# Patient Record
Sex: Female | Born: 1970 | Race: White | Hispanic: No | Marital: Single | Smoking: Never smoker
Health system: Southern US, Community
[De-identification: ages and names within clinical notes are randomized; demographics above are authoritative.]

## PROBLEM LIST (undated history)

## (undated) DIAGNOSIS — J309 Allergic rhinitis, unspecified: Secondary | ICD-10-CM

## (undated) DIAGNOSIS — I10 Essential (primary) hypertension: Secondary | ICD-10-CM

## (undated) DIAGNOSIS — F988 Other specified behavioral and emotional disorders with onset usually occurring in childhood and adolescence: Secondary | ICD-10-CM

## (undated) DIAGNOSIS — B001 Herpesviral vesicular dermatitis: Secondary | ICD-10-CM

## (undated) DIAGNOSIS — K589 Irritable bowel syndrome without diarrhea: Secondary | ICD-10-CM

## (undated) HISTORY — DX: Essential (primary) hypertension: I10

## (undated) HISTORY — DX: Allergic rhinitis, unspecified: J30.9

## (undated) HISTORY — DX: Other specified behavioral and emotional disorders with onset usually occurring in childhood and adolescence: F98.8

## (undated) HISTORY — DX: Herpesviral vesicular dermatitis: B00.1

## (undated) HISTORY — PX: CHOLECYSTECTOMY: SHX55

## (undated) HISTORY — DX: Irritable bowel syndrome without diarrhea: K58.9

---

## 2007-05-11 ENCOUNTER — Encounter: Payer: Self-pay | Admitting: Cardiovascular Disease

## 2007-05-11 ENCOUNTER — Ambulatory Visit: Payer: Self-pay | Admitting: Cardiology

## 2007-05-12 ENCOUNTER — Encounter: Payer: Self-pay | Admitting: Cardiovascular Disease

## 2010-12-09 ENCOUNTER — Ambulatory Visit (INDEPENDENT_AMBULATORY_CARE_PROVIDER_SITE_OTHER): Payer: Medicaid Other | Admitting: Internal Medicine

## 2010-12-09 DIAGNOSIS — K589 Irritable bowel syndrome without diarrhea: Secondary | ICD-10-CM

## 2010-12-29 ENCOUNTER — Ambulatory Visit (INDEPENDENT_AMBULATORY_CARE_PROVIDER_SITE_OTHER): Payer: Self-pay | Admitting: Internal Medicine

## 2011-02-23 ENCOUNTER — Ambulatory Visit (INDEPENDENT_AMBULATORY_CARE_PROVIDER_SITE_OTHER): Payer: Self-pay | Admitting: Internal Medicine

## 2013-08-31 ENCOUNTER — Encounter: Payer: Self-pay | Admitting: Cardiovascular Disease

## 2013-09-20 ENCOUNTER — Encounter: Payer: Self-pay | Admitting: Cardiovascular Disease

## 2013-09-21 ENCOUNTER — Encounter: Payer: Self-pay | Admitting: *Deleted

## 2013-09-25 ENCOUNTER — Ambulatory Visit (INDEPENDENT_AMBULATORY_CARE_PROVIDER_SITE_OTHER): Payer: Medicaid Other | Admitting: Cardiovascular Disease

## 2013-09-25 ENCOUNTER — Encounter: Payer: Self-pay | Admitting: Cardiovascular Disease

## 2013-09-25 ENCOUNTER — Telehealth: Payer: Self-pay | Admitting: Cardiovascular Disease

## 2013-09-25 VITALS — BP 168/108 | HR 68 | Ht 63.0 in | Wt 253.0 lb

## 2013-09-25 DIAGNOSIS — R079 Chest pain, unspecified: Secondary | ICD-10-CM

## 2013-09-25 DIAGNOSIS — I10 Essential (primary) hypertension: Secondary | ICD-10-CM

## 2013-09-25 MED ORDER — LOSARTAN POTASSIUM 25 MG PO TABS: 25.0000 mg | ORAL_TABLET | Freq: Every day | ORAL | Status: DC

## 2013-09-25 NOTE — Progress Notes (Signed)
Patient ID: Cynthia Meyers, female   DOB: 1970-11-23, 43 y.o.   MRN: [id number]       CARDIOLOGY CONSULT NOTE  Patient ID: Cynthia Meyers MRN: [id number] DOB/AGE: 21-Jun-1971 43 y.o.  Admit date: (Not on file) Primary Physician Ernestine Conrad, MD  Reason for Consultation: chest pain, HTN  HPI: The patient is a 43 year old female who I am evaluating for the first time. She has a history of hypertension and obesity. She was evaluated in the emergency room at [facility] on February 19 for left upper chest pain which radiated to the left shoulder and neck. It was deemed musculoskeletal in etiology. It was previously recommended that she be on an antihypertensive but the patient refused. She underwent a chest x-ray which showed no active cardiopulmonary disease her ECG showed normal sinus rhythm and was unremarkable. Her troponin was normal as was her BNP (46). Her blood pressure was 178/125 with a heart rate of 92 beats per minute. Her potassium was mildly low at 3.4. She had a normal stress echocardiogram on 05/12/2007. She has occasionally been feeling a left upper chest sensation which she describes as a intermittent squeezing. It does not radiate. She denies associated shortness of breath. She says that she does not take care of her health. Every summer she goes out to [place] to visit her brother with her 2 children. When she does so, she stays physically active and does a considerable amount of biking. However, when she returns to [place], she rarely gets physical activity. She will go to the gym for a few months and lose some weight and then quit. She occasionally has bilateral ankle swelling.  Soc: Denies tobacco use. Drinks 1 beer daily. 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 Fam: father has heart disease and "heart surgery" in his 22's.   No Known Allergies  Current Outpatient  Prescriptions  Medication Sig Dispense Refill  . acyclovir (ZOVIRAX) 800 MG tablet Take 800 mg by mouth as needed.      Marland Kitchen aspirin-acetaminophen-caffeine (EXCEDRIN MIGRAINE) 250-250-65 MG per tablet Take 1 tablet by mouth every 6 (six) hours as needed for headache.      . Pseudoephedrine-Guaifenesin (MUCINEX D) [phone] MG TB12 Take 1 tablet by mouth as needed.       No current facility-administered medications for this visit.    Past Medical History  Diagnosis Date  . Hypertension   . Allergic rhinitis   . IBS (irritable bowel syndrome)   . ADD (attention deficit disorder)   . Herpes labialis     Past Surgical History  Procedure Laterality Date  . Cholecystectomy      History   Social History  . Marital Status: Single    Spouse Name: N/A    Number of Children: N/A  . Years of Education: N/A   Occupational History  . STUDENT     [employer]   Social History Main Topics  . Smoking status: Never Smoker   . Smokeless tobacco: Never Used  . Alcohol Use: Yes     Comment: 1-2 drinks, special events 3-4 x's a year  . Drug Use: Not on file  . Sexual Activity: Not on file   Other Topics Concern  . Not on file   Social History Narrative   2 daughters       Prior to Admission medications   Medication Sig Start Date End Date  Taking? Authorizing Provider  acyclovir (ZOVIRAX) 800 MG tablet Take 800 mg by mouth as needed.   Yes Historical Provider, MD  aspirin-acetaminophen-caffeine (EXCEDRIN MIGRAINE) [phone] MG per tablet Take 1 tablet by mouth every 6 (six) hours as needed for headache.   Yes Historical Provider, MD  Pseudoephedrine-Guaifenesin (MUCINEX D) [phone] MG TB12 Take 1 tablet by mouth as needed.   Yes Historical Provider, MD     Review of systems complete and found to be negative unless listed above in HPI     Physical exam Blood pressure 168/108, pulse 68, height 5\' 3"  (1.6 m), weight 253 lb (114.76 kg), SpO2 98.00%. General: NAD Neck: No JVD, no  thyromegaly or thyroid nodule.  Lungs: Clear to auscultation bilaterally with normal respiratory effort. CV: Nondisplaced PMI.  Heart regular S1/S2, no S3/S4, no murmur.  No peripheral edema.  No carotid bruit.  Normal pedal pulses.  Abdomen: Soft, nontender, no hepatosplenomegaly, no distention.  Skin: Intact without lesions or rashes.  Neurologic: Alert and oriented x 3.  Psych: Normal affect. Extremities: No clubbing or cyanosis.  HEENT: Normal.   Labs:   No results found for this basename: WBC, HGB, HCT, MCV, PLT   No results found for this basename: NA, K, CL, CO2, BUN, CREATININE, CALCIUM, LABALBU, PROT, BILITOT, ALKPHOS, ALT, AST, GLUCOSE,  in the last 168 hours No results found for this basename: CKTOTAL, CKMB, CKMBINDEX, TROPONINI    No results found for this basename: CHOL   No results found for this basename: HDL   No results found for this basename: LDLCALC   No results found for this basename: TRIG   No results found for this basename: CHOLHDL   No results found for this basename: LDLDIRECT         Studies: No results found.  ASSESSMENT AND PLAN:  1. Chest pain: atypical features, and may be related to her uncontrolled HTN. I will obtain an echocardiogram to evaluate for structural heart disease. 2. HTN: will start losartan 25 mg daily. I gave her extensive counseling on lifestyle modification via diet and exercise.  Dispo: f/u 1 month.  Signed: Prentice DockerSuresh Koneswaran, M.D., F.A.C.C.  09/25/2013, 11:09 AM

## 2013-09-25 NOTE — Patient Instructions (Signed)
Your physician has requested that you have an echocardiogram. Echocardiography is a painless test that uses sound waves to create images of your heart. It provides your doctor with information about the size and shape of your heart and how well your heart's chambers and valves are working. This procedure takes approximately one hour. There are no restrictions for this procedure. Office will contact with results via phone or letter.   Begin Losartan 25mg  daily - new sent to pharm Continue all other medications.   Follow up in  4-6 weeks

## 2013-09-25 NOTE — Telephone Encounter (Signed)
Mrs. Cynthia Meyers called back to the office stating that she forgot to ask Dr. Purvis SheffieldKoneswaran if she can resume her exercise program And also if her elevated blood pressure could be causing her eyes to be tired.

## 2013-09-25 NOTE — Telephone Encounter (Signed)
Please advise, patient in office today.

## 2013-09-26 NOTE — Telephone Encounter (Signed)
Ok to resume exercise regimen, so long as she has begun her antihypertensive treatment. Do not feel eye fatigue is related to BP.

## 2013-09-26 NOTE — Telephone Encounter (Signed)
Patient notified and verbalized understanding.  Stated she will call for appointment with her optometrist.

## 2013-10-04 ENCOUNTER — Other Ambulatory Visit: Payer: Self-pay

## 2013-10-04 ENCOUNTER — Other Ambulatory Visit (INDEPENDENT_AMBULATORY_CARE_PROVIDER_SITE_OTHER): Payer: Medicaid Other

## 2013-10-04 DIAGNOSIS — I10 Essential (primary) hypertension: Secondary | ICD-10-CM

## 2013-10-04 DIAGNOSIS — R079 Chest pain, unspecified: Secondary | ICD-10-CM

## 2013-10-09 ENCOUNTER — Telehealth: Payer: Self-pay | Admitting: *Deleted

## 2013-10-09 NOTE — Telephone Encounter (Signed)
Message copied by Lesle ChrisHILL, ANGELA G on Mon Oct 09, 2013  1:20 PM ------      Message from: Prentice DockerKONESWARAN, SURESH A      Created: Thu Oct 05, 2013  9:19 AM       Can inform pt normal pumping function of heart. I will discuss additional details at her f/u ov. ------

## 2013-10-09 NOTE — Telephone Encounter (Signed)
Notes Recorded by Lesle ChrisAngela G Nikolai Wilczak, LPN on 1/61/09603/30/2015 at 1:20 PM Patient notified and verbalized understanding.

## 2013-10-26 ENCOUNTER — Encounter: Payer: Self-pay | Admitting: Cardiovascular Disease

## 2013-10-26 ENCOUNTER — Ambulatory Visit (INDEPENDENT_AMBULATORY_CARE_PROVIDER_SITE_OTHER): Payer: Medicaid Other | Admitting: Cardiovascular Disease

## 2013-10-26 VITALS — BP 147/98 | HR 80 | Ht 63.0 in | Wt 254.0 lb

## 2013-10-26 DIAGNOSIS — R079 Chest pain, unspecified: Secondary | ICD-10-CM

## 2013-10-26 DIAGNOSIS — I10 Essential (primary) hypertension: Secondary | ICD-10-CM

## 2013-10-26 MED ORDER — LOSARTAN POTASSIUM 50 MG PO TABS: 50.0000 mg | ORAL_TABLET | Freq: Every day | ORAL | Status: DC

## 2013-10-26 NOTE — Progress Notes (Signed)
Patient ID: Cynthia LarsenJennifer J Meyers, female   DOB: 1971-04-18, 43 y.o.   MRN: [id number]      SUBJECTIVE: The patient is here to followup on the results of cardiac testing performed for the evaluation of chest pain and hypertension. Her echocardiogram showed normal left ventricular systolic function, grade 1 diastolic dysfunction, trivial valvular regurgitation, possible small PFO, and a trivial pericardial effusion. After starting her losartan, her headaches subsided for the first time in over a month. She felt very well for the next 4-5 days. However, as her blood pressure started elevating again, her headaches returned. She denies any chest pain. She has had some peri-ankle swelling. She denies shortness of breath. She has not been exercising and says that her diet has been poor over the past 2 weeks, as she has attended a lot of parties and has eaten a lot of cake.  No Known Allergies  Current Outpatient Prescriptions  Medication Sig Dispense Refill  . acyclovir (ZOVIRAX) 800 MG tablet Take 800 mg by mouth as needed.      Marland Kitchen. aspirin-acetaminophen-caffeine (EXCEDRIN MIGRAINE) 250-250-65 MG per tablet Take 1 tablet by mouth every 6 (six) hours as needed for headache.      . furosemide (LASIX) 20 MG tablet Take 20 mg by mouth as needed.      Marland Kitchen. losartan (COZAAR) 25 MG tablet Take 1 tablet (25 mg total) by mouth daily.  30 tablet  6  . Pseudoephedrine-Guaifenesin (MUCINEX D) [phone] MG TB12 Take 1 tablet by mouth as needed.       No current facility-administered medications for this visit.    Past Medical History  Diagnosis Date  . Hypertension   . Allergic rhinitis   . IBS (irritable bowel syndrome)   . ADD (attention deficit disorder)   . Herpes labialis     Past Surgical History  Procedure Laterality Date  . Cholecystectomy      History   Social History  . Marital Status: Single    Spouse Name: N/A    Number of Children: N/A  . Years of Education: N/A   Occupational History    . STUDENT     [employer]   Social History Main Topics  . Smoking status: Never Smoker   . Smokeless tobacco: Never Used  . Alcohol Use: Yes     Comment: 1-2 drinks, special events 3-4 x's a year  . Drug Use: Not on file  . Sexual Activity: Not on file   Other Topics Concern  . Not on file   Social History Narrative   2 daughters     Ceasar MonsFiled Vitals:   10/26/13 1018  Height: 5\' 3"  (1.6 m)  Weight: 254 lb (115.214 kg)   BP 147/98  Pulse 80   PHYSICAL EXAM General: NAD Neck: No JVD, no thyromegaly. Lungs: Clear to auscultation bilaterally with normal respiratory effort. CV: Nondisplaced PMI.  Regular rate and rhythm, normal S1/S2, no S3/S4, no murmur. No pretibial or periankle edema.  No carotid bruit.  Normal pedal pulses.  Abdomen: Soft, nontender, no hepatosplenomegaly, no distention.  Neurologic: Alert and oriented x 3.  Psych: Normal affect. Extremities: No clubbing or cyanosis.   ECG: reviewed and available in electronic records.  Study Conclusions  - Left ventricle: The cavity size was normal. Wall thickness was at the upper limits of normal. Systolic function was normal. The estimated ejection fraction was in the range of 55% to 60%. Wall motion was normal; there were no regional wall motion abnormalities. Doppler  parameters are consistent with abnormal left ventricular relaxation (grade 1 diastolic dysfunction). - Mitral valve: Trivial regurgitation. - Right atrium: Central venous pressure: 8mm Hg (est). - Atrial septum: A patent foramen ovale cannot be excluded, with very small left to right shunt. - Tricuspid valve: Trivial regurgitation. - Pulmonary arteries: Systolic pressure could not be accurately estimated. - Pericardium, extracardiac: A trivial pericardial effusion was identified posterior to the heart. Impressions:  - Normal LV chamber size with upper normal wall thickness, LVEF 55-60%, no obvious wall motion abnormalities with somewhat limited  imaging. Grade 1 diastolic dysfunction. Trivial mitral and tricuspid regurgitation. Unable to assess PASP. Cannot exclude PFO with very small left to right shunt by color Doppler. Trivial posterior pericardial effusion.      ASSESSMENT AND PLAN: 1. Chest pain: LV systolic function is normal. No further recurrences. 2. HTN: Remains elevated. Will increase losartan to 50 mg daily. I again encouraged her to be careful with sodium, alcohol, and saturated fat intake, and emphasized the importance of routine exercise.  Dispo: I will have her followup in the next 2-3 weeks with a nurse to have her blood pressure checked. She can follow up with me in 6 months.  Prentice DockerSuresh Madgie Dhaliwal, M.D., F.A.C.C.

## 2013-10-26 NOTE — Patient Instructions (Signed)
   Increase Losartan to 50mg  daily - may take 2 tabs of your 25mg  tablet daily till finish current supply (new sent to pharm) Continue all other medications.   Nurse visit in 2-3 weeks for blood pressure check  Your physician wants you to follow up in: 6 months.  You will receive a reminder letter in the mail one-two months in advance.  If you don't receive a letter, please call our office to schedule the follow up appointment

## 2013-11-07 ENCOUNTER — Encounter: Payer: Self-pay | Admitting: Cardiovascular Disease

## 2013-11-16 ENCOUNTER — Ambulatory Visit (INDEPENDENT_AMBULATORY_CARE_PROVIDER_SITE_OTHER): Payer: Medicaid Other | Admitting: *Deleted

## 2013-11-16 VITALS — BP 128/84 | HR 80

## 2013-11-16 DIAGNOSIS — I10 Essential (primary) hypertension: Secondary | ICD-10-CM

## 2013-11-20 NOTE — Progress Notes (Signed)
Patient in office on 11/16/2013 for BP check.    BP 140/90  75   After sitting 10 minutes BP came down to 128/84  80.

## 2013-11-21 ENCOUNTER — Telehealth: Payer: Self-pay | Admitting: *Deleted

## 2013-11-21 NOTE — Progress Notes (Signed)
Well controlled after resting. No changes in meds.   ----- Message ----- From: Lesle ChrisAngela G Maylea Soria, LPN Sent: 1/61/09605/05/2014 10:02 AM To: Laqueta LindenSuresh A Koneswaran, MD  Patient notified.

## 2013-11-21 NOTE — Telephone Encounter (Signed)
Patient notified

## 2013-11-21 NOTE — Telephone Encounter (Signed)
Message copied by Lesle ChrisHILL, ANGELA G on Tue Nov 21, 2013  9:26 AM ------      Message from: Prentice DockerKONESWARAN, SURESH A      Created: Mon Nov 20, 2013  1:10 PM       Well controlled after resting. No changes in meds.            ----- Message -----         From: Lesle ChrisAngela G Hill, LPN         Sent: 11/20/2013  10:02 AM           To: Laqueta LindenSuresh A Koneswaran, MD                   ------

## 2014-01-08 ENCOUNTER — Other Ambulatory Visit: Payer: Self-pay | Admitting: *Deleted

## 2014-01-08 MED ORDER — BENAZEPRIL HCL 20 MG PO TABS: 20.0000 mg | ORAL_TABLET | Freq: Every day | ORAL | Status: DC

## 2014-02-09 ENCOUNTER — Telehealth: Payer: Self-pay | Admitting: *Deleted

## 2014-02-09 NOTE — Telephone Encounter (Signed)
Message left on voice mail - changed her mind & decided she wanted to change the Losartan to something her insurance will cover.  Initially had decided to pay out of pocket.     Attempted to return call - mail box full.

## 2014-02-13 NOTE — Telephone Encounter (Signed)
Attempted to return call again.  Voice mail box is full.

## 2014-02-15 NOTE — Telephone Encounter (Signed)
Attempted to return call.  This is 3rd attempt with no call back yet.  Voice mail box is full.

## 2014-06-18 ENCOUNTER — Other Ambulatory Visit: Payer: Self-pay | Admitting: Cardiovascular Disease

## 2014-12-28 ENCOUNTER — Ambulatory Visit (INDEPENDENT_AMBULATORY_CARE_PROVIDER_SITE_OTHER): Payer: Medicaid Other

## 2014-12-28 ENCOUNTER — Other Ambulatory Visit (HOSPITAL_COMMUNITY)
Admission: RE | Admit: 2014-12-28 | Discharge: 2014-12-28 | Disposition: A | Discharge status: Home / Self Care | Payer: Medicaid Other | Source: Ambulatory Visit | Attending: Adult Health | Admitting: Adult Health

## 2014-12-28 ENCOUNTER — Telehealth: Payer: Self-pay | Admitting: Cardiovascular Disease

## 2014-12-28 VITALS — BP 146/100 | HR 81

## 2014-12-28 DIAGNOSIS — I10 Essential (primary) hypertension: Secondary | ICD-10-CM

## 2014-12-28 LAB — BASIC METABOLIC PANEL
ANION GAP: 8 (ref 5–15)
BUN: 13 mg/dL (ref 6–20)
CALCIUM: 9.3 mg/dL (ref 8.9–10.3)
CHLORIDE: 104 mmol/L (ref 101–111)
CO2: 27 mmol/L (ref 22–32)
Creatinine, Ser: 0.88 mg/dL (ref 0.44–1.00)
GFR calc Af Amer: >60 mL/min (ref >60)
GFR calc non Af Amer: >60 mL/min (ref >60)
Glucose, Bld: 97 mg/dL (ref 65–99)
Potassium: 3.5 mmol/L (ref 3.5–5.1)
SODIUM: 139 mmol/L (ref 135–145)

## 2014-12-28 LAB — TROPONIN I: Troponin I: <0.03 ng/mL (ref <0.031)

## 2014-12-28 MED ORDER — HYDROCHLOROTHIAZIDE 12.5 MG PO CAPS: 12.5000 mg | ORAL_CAPSULE | Freq: Every day | ORAL | Status: DC

## 2014-12-28 NOTE — Telephone Encounter (Signed)
Patient is scheduled to see Dr. Purvis Sheffield on Monday at 3pm.

## 2014-12-28 NOTE — Progress Notes (Signed)
Patient presents for c/o elevated BP,fluttering "evervescent " sensation in chest for past 4 days BP 146/100.Admits to eating pretzels and [place] food last week along with missing her losartan dose atleast once. She has not taken lasix at all.Seen by Ms.Lawrence who asked pt to watch her sodium and BP.Given BP log to follow and low sodium guidelines pamphlet.She is to take HCTZ 12.5 mg daily and not use Lasix at this time. Suggested she use zantac (ranitidine) 150 mg daily and f/u with Dr Purvis Sheffield on Monday 6/20/156 as scheduled   Labs BMET,troponin per pt request

## 2014-12-28 NOTE — Telephone Encounter (Signed)
Pt will come to  office for BP check

## 2014-12-28 NOTE — Patient Instructions (Signed)
Your physician recommends that you schedule a follow-up appointment in: Monday 6/20 with Dr Purvis Sheffield as scheduled  Start HCTZ 12.5 mg daily  Take Zantac (ranitidine) 150 mg daily.   Get lab work BMET,Troponin      Thank you for choosing [facility] !

## 2014-12-31 ENCOUNTER — Encounter: Payer: Self-pay | Admitting: *Deleted

## 2014-12-31 ENCOUNTER — Ambulatory Visit (INDEPENDENT_AMBULATORY_CARE_PROVIDER_SITE_OTHER): Payer: Medicaid Other | Admitting: Cardiovascular Disease

## 2014-12-31 VITALS — BP 132/87 | HR 76 | Ht 63.0 in | Wt 254.0 lb

## 2014-12-31 DIAGNOSIS — I10 Essential (primary) hypertension: Secondary | ICD-10-CM

## 2014-12-31 DIAGNOSIS — R079 Chest pain, unspecified: Secondary | ICD-10-CM | POA: Diagnosis not present

## 2014-12-31 DIAGNOSIS — M7989 Other specified soft tissue disorders: Secondary | ICD-10-CM

## 2014-12-31 DIAGNOSIS — R002 Palpitations: Secondary | ICD-10-CM | POA: Diagnosis not present

## 2014-12-31 MED ORDER — POTASSIUM CHLORIDE CRYS ER 20 MEQ PO TBCR: 20.0000 meq | EXTENDED_RELEASE_TABLET | Freq: Every day | ORAL | Status: DC

## 2014-12-31 NOTE — Progress Notes (Signed)
Patient ID: Cynthia Meyers, female   DOB: [birth year], 44 y.o.   MRN: [id number]      SUBJECTIVE: The patient returns for follow-up of hypertension. I last evaluated her in April 2015. At that time I started her on losartan. She was evaluated 3 days ago by my nurse in the [place] office for palpitations and elevated blood pressure, 146/100. She had been eating salty foods and at least one dose of losartan. She was started on hydrochlorothiazide 12.5 mg daily and Lasix was stopped.  Labs included normal troponin, sodium 139, BUN 13, creatinine 0.88. Potassium was low-normal at 3.5.  She is feeling much better now. By her home scale, she has lost between 5 and 8 pounds of fluid since starting hydrochlorothiazide. Her palpitations have resolved. She had been feeling symptoms of indigestion but these symptoms have also resolved. She has also been experiencing calf and feet cramping for several months.  Soc: Studying beer brewing.  Review of Systems: As per "subjective", otherwise negative.  No Known Allergies  Current Outpatient Prescriptions  Medication Sig Dispense Refill  . acyclovir (ZOVIRAX) 800 MG tablet Take 800 mg by mouth as needed.    Marland Kitchen aspirin-acetaminophen-caffeine (EXCEDRIN MIGRAINE) 250-250-65 MG per tablet Take 1 tablet by mouth every 6 (six) hours as needed for headache.    . furosemide (LASIX) 20 MG tablet Take 20 mg by mouth as needed.    . hydrochlorothiazide (MICROZIDE) 12.5 MG capsule Take 1 capsule (12.5 mg total) by mouth daily. 90 capsule 3  . losartan (COZAAR) 50 MG tablet TAKE 1 TABLET BY MOUTH DAILY 30 tablet 6   No current facility-administered medications for this visit.    Past Medical History  Diagnosis Date  . Hypertension   . Allergic rhinitis   . IBS (irritable bowel syndrome)   . ADD (attention deficit disorder)   . Herpes labialis     Past Surgical History  Procedure Laterality Date  . Cholecystectomy      History   Social History  .  Marital Status: Single    Spouse Name: N/A  . Number of Children: N/A  . Years of Education: N/A   Occupational History  . STUDENT     [employer]   Social History Main Topics  . Smoking status: Never Smoker   . Smokeless tobacco: Never Used  . Alcohol Use: 0.0 oz/week    0 Standard drinks or equivalent per week     Comment: 1-2 drinks, special events 3-4 x's a year  . Drug Use: Not on file  . Sexual Activity: Not on file   Other Topics Concern  . Not on file   Social History Narrative   2 daughters     Ceasar Mons Vitals:   12/31/14 1457  BP: 132/87  Pulse: 76  Height: 5\' 3"  (1.6 m)  Weight: 254 lb (115.214 kg)    PHYSICAL EXAM General: NAD HEENT: Normal. Neck: No JVD, no thyromegaly. Lungs: Clear to auscultation bilaterally with normal respiratory effort. CV: Nondisplaced PMI.  Regular rate and rhythm, normal S1/S2, no S3/S4, no murmur. No pretibial or periankle edema.  No carotid bruit.  Normal pedal pulses.  Abdomen: Soft, nontender, obese, no distention.  Neurologic: Alert and oriented x 3.  Psych: Normal affect. Skin: Normal. Musculoskeletal: Normal range of motion, no gross deformities. Extremities: No clubbing or cyanosis.   ECG: Most recent ECG reviewed.      ASSESSMENT AND PLAN: 1. Essential hypertension with feet swelling: Resolved with institution of HCTZ 12.5  mg daily. Given low normal potassium level, will start 20 meq KCl daily. I have asked the patient to check blood pressure readings 3 times per week, at different times throughout the day, in order to get a better approximation of mean BP values. These results will be provided to me at the end of that period so that I can determine if antihypertensive medication titration is indicated.   Dispo: f/u 6 months.  Prentice Docker, M.D., F.A.C.C.

## 2014-12-31 NOTE — Addendum Note (Signed)
Addended by: Lesle Chris on: 12/31/2014 03:35 PM   Modules accepted: Orders, Level of Service

## 2014-12-31 NOTE — Patient Instructions (Signed)
   Begin Potassium daily - new sent to [facility] today. Continue all other medications.   Your physician has requested that you regularly monitor and record your blood pressure readings at home. Please take readings at varied times of the day 3 x per week for 1 month.  Return to office for MD review. Your physician wants you to follow up in: 6 months.  You will receive a reminder letter in the mail one-two months in advance.  If you don't receive a letter, please call our office to schedule the follow up appointment

## 2015-01-01 ENCOUNTER — Telehealth: Payer: Self-pay | Admitting: *Deleted

## 2015-01-01 ENCOUNTER — Telehealth: Payer: Self-pay | Admitting: Cardiovascular Disease

## 2015-01-01 NOTE — Telephone Encounter (Signed)
Mrs. Thresa Ross called stating that she has not received her medication from pharmacy today.

## 2015-01-01 NOTE — Telephone Encounter (Signed)
While symptoms can be indicative of viral prodrome, can also have myalgias due to hypokalemia. Would recommend starting KCl and supportive measures such as Tylenol. No indication for further testing.

## 2015-01-01 NOTE — Telephone Encounter (Signed)
Patient notified

## 2015-01-01 NOTE — Telephone Encounter (Signed)
Patient seen in office yesterday.  Stated she was feeling fine at OV yesterday, but since last evening & this morning she has been having uncomfortable feeling in her chest.  No SOB, no dizziness.  Describes the feeling in her chest / bottom of ribcage area as the way you would feel if you had a congested cough and made your lungs burn.  Shoulders achy.  Did notice hiccups when she started going to sleep last evening.  Stated that she did not get the Zantac as suggested because she was not sure about the cost.  States that she has not picked up her Potassium yet, but will do so today.  Has continued with the HCTZ, but does have some nausea with this.  Informed patient that message will be sent to provider for further advice.

## 2015-01-01 NOTE — Telephone Encounter (Signed)
Call placed to [facility], pharm stated they do not have.  Give prescription verbally.  Patient notified.

## 2015-09-09 ENCOUNTER — Other Ambulatory Visit: Payer: Self-pay | Admitting: Cardiovascular Disease

## 2015-11-18 ENCOUNTER — Ambulatory Visit (INDEPENDENT_AMBULATORY_CARE_PROVIDER_SITE_OTHER): Payer: Medicaid Other | Admitting: Cardiovascular Disease

## 2015-11-18 ENCOUNTER — Encounter: Payer: Self-pay | Admitting: Cardiovascular Disease

## 2015-11-18 VITALS — BP 136/85 | HR 75 | Ht 63.0 in | Wt 269.0 lb

## 2015-11-18 DIAGNOSIS — M7989 Other specified soft tissue disorders: Secondary | ICD-10-CM | POA: Diagnosis not present

## 2015-11-18 DIAGNOSIS — I10 Essential (primary) hypertension: Secondary | ICD-10-CM | POA: Diagnosis not present

## 2015-11-18 DIAGNOSIS — K219 Gastro-esophageal reflux disease without esophagitis: Secondary | ICD-10-CM | POA: Diagnosis not present

## 2015-11-18 MED ORDER — FUROSEMIDE 20 MG PO TABS: 20.0000 mg | ORAL_TABLET | ORAL | Status: DC | PRN

## 2015-11-18 MED ORDER — POTASSIUM CHLORIDE CRYS ER 20 MEQ PO TBCR: 20.0000 meq | EXTENDED_RELEASE_TABLET | Freq: Every day | ORAL | Status: DC | PRN

## 2015-11-18 MED ORDER — OMEPRAZOLE 20 MG PO CPDR: 20.0000 mg | DELAYED_RELEASE_CAPSULE | Freq: Every day | ORAL | Status: DC

## 2015-11-18 NOTE — Progress Notes (Signed)
Patient ID: Cynthia LarsenJennifer J Meyers, female   DOB: 19-Jun-1971, 45 y.o.   MRN: [id number]      SUBJECTIVE: The patient returns for follow-up of essential hypertension and feet swelling. She did not want to take hydrochlorothiazide for fear of developing diabetes. It appears her PCP prescribed losartan and Lasix as needed but she said she never got the prescription at the pharmacy. She continues to complain of feet swelling. She also has other somatic complaints including acid reflux disease. She has taken Tums but developed constipation so she stopped. She also complains of progressive hearing loss.   Soc: Studying beer brewing.  Review of Systems: As per "subjective", otherwise negative.  No Known Allergies  Current Outpatient Prescriptions  Medication Sig Dispense Refill  . acyclovir (ZOVIRAX) 800 MG tablet Take 800 mg by mouth as needed.    Marland Kitchen. amphetamine-dextroamphetamine (ADDERALL) 15 MG tablet Take 1 tablet by mouth 2 (two) times daily as needed.  0  . furosemide (LASIX) 20 MG tablet Take 20 mg by mouth as needed.    Marland Kitchen. losartan (COZAAR) 50 MG tablet TAKE 1 TABLET BY MOUTH DAILY 30 tablet 6  . potassium chloride SA (K-DUR,KLOR-CON) 20 MEQ tablet TAKE 1 TABLET BY MOUTH EVERY DAY (Patient taking differently: TAKE 1 TABLET BY MOUTH EVERY DAY as needed) 30 tablet 0  . aspirin-acetaminophen-caffeine (EXCEDRIN MIGRAINE) 250-250-65 MG per tablet Take 1 tablet by mouth every 6 (six) hours as needed for headache.     No current facility-administered medications for this visit.    Past Medical History  Diagnosis Date  . Hypertension   . Allergic rhinitis   . IBS (irritable bowel syndrome)   . ADD (attention deficit disorder)   . Herpes labialis     Past Surgical History  Procedure Laterality Date  . Cholecystectomy      Social History   Social History  . Marital Status: Single    Spouse Name: N/A  . Number of Children: N/A  . Years of Education: N/A   Occupational History  .  STUDENT     [employer]   Social History Main Topics  . Smoking status: Never Smoker   . Smokeless tobacco: Never Used  . Alcohol Use: 0.0 oz/week    0 Standard drinks or equivalent per week     Comment: 1-2 drinks, special events 3-4 x's a year  . Drug Use: Not on file  . Sexual Activity: Not on file   Other Topics Concern  . Not on file   Social History Narrative   2 daughters     Ceasar MonsFiled Vitals:   11/18/15 1414  BP: 136/85  Pulse: 75  Height: 5\' 3"  (1.6 m)  Weight: 269 lb (122.018 kg)    PHYSICAL EXAM General: NAD HEENT: Normal. Neck: No JVD, no thyromegaly. Lungs: Clear to auscultation bilaterally with normal respiratory effort. CV: Nondisplaced PMI.  Regular rate and rhythm, normal S1/S2, no S3/S4, no murmur. No pretibial or periankle edema.  No carotid bruits. Abdomen: Soft, obese.  Neurologic: Alert and oriented.  Psych: Normal affect. Skin: Normal. Musculoskeletal: No gross deformities.    ECG: Most recent ECG reviewed.      ASSESSMENT AND PLAN: 1. Essential hypertension: Reasonably controlled on losartan 50 mg. Will refill. No changes. Needs weight loss.  2. Feet swelling: Will prescribe Lasix prn.  3. GERD: Will prescribe omeprazole 20 mg daily x 30 days.  Dispo: fu 1 year. Encouraged to see PCP for basic preventative care.  Prentice DockerSuresh Myrth Dahan, M.D., F.A.C.C.

## 2015-11-18 NOTE — Patient Instructions (Addendum)
   Lasix 20mg  as needed.  Potassium refill sent to pharmacy.   Begin Omeprazole 20mg  daily (# 30 with 1 refill) - future refills need to come from Dr. Loney HeringBluth.  All medications [facility].  Continue all current medications. Your physician wants you to follow up in:  1 year.  You will receive a reminder letter in the mail one-two months in advance.  If you don't receive a letter, please call our office to schedule the follow up appointment

## 2016-05-27 ENCOUNTER — Other Ambulatory Visit: Payer: Self-pay | Admitting: Cardiovascular Disease

## 2016-09-24 ENCOUNTER — Other Ambulatory Visit: Payer: Self-pay | Admitting: Cardiovascular Disease

## 2016-09-28 ENCOUNTER — Telehealth: Payer: Self-pay | Admitting: *Deleted

## 2016-09-28 NOTE — Telephone Encounter (Signed)
Would take omeprazole 20 mg daily until I see her.

## 2016-09-28 NOTE — Telephone Encounter (Signed)
Patient notified

## 2016-09-28 NOTE — Telephone Encounter (Signed)
Complain of burning sensation in between breast, sternal area.  Radiates out through chest area.   No SOB, dizziness. States she has had this off/on - was given Prilosec at last OV but only takes as needed.  Bothering x last month, but worse the last few days.   Stress a factor lately.  OV scheduled for 10/05/2016 here in [place].

## 2016-10-05 ENCOUNTER — Ambulatory Visit: Payer: Medicaid Other | Admitting: Cardiovascular Disease

## 2016-10-28 ENCOUNTER — Encounter: Payer: Self-pay | Admitting: Cardiovascular Disease

## 2016-10-28 ENCOUNTER — Ambulatory Visit: Payer: Medicaid Other | Admitting: Cardiovascular Disease

## 2016-11-20 ENCOUNTER — Other Ambulatory Visit: Payer: Self-pay | Admitting: Cardiovascular Disease

## 2016-12-04 ENCOUNTER — Other Ambulatory Visit: Payer: Self-pay | Admitting: Cardiovascular Disease

## 2016-12-30 ENCOUNTER — Other Ambulatory Visit: Payer: Self-pay | Admitting: Cardiovascular Disease

## 2017-01-30 ENCOUNTER — Other Ambulatory Visit: Payer: Self-pay | Admitting: Cardiovascular Disease

## 2017-03-02 ENCOUNTER — Other Ambulatory Visit: Payer: Self-pay | Admitting: Cardiovascular Disease

## 2017-04-30 ENCOUNTER — Telehealth: Payer: Self-pay | Admitting: *Deleted

## 2017-04-30 ENCOUNTER — Ambulatory Visit (INDEPENDENT_AMBULATORY_CARE_PROVIDER_SITE_OTHER): Payer: Medicaid Other | Admitting: *Deleted

## 2017-04-30 VITALS — BP 146/94 | HR 67 | Ht 63.0 in | Wt 248.2 lb

## 2017-04-30 DIAGNOSIS — I10 Essential (primary) hypertension: Secondary | ICD-10-CM | POA: Diagnosis not present

## 2017-04-30 NOTE — Telephone Encounter (Signed)
Pt says at work pt face got hot and flushed, says chest feels "weird" and hollowing feeling - denies CP/dizziness. Pt requesting we check her BP and was calling from our parking lot. Will add to nurse schedule and check pt vitals.

## 2017-04-30 NOTE — Progress Notes (Signed)
Patient presents to office to have her BP checked per her request. Patient said that while at work today [employer]), that she all of a sudden began feel flushed and hot in her face and felt weird in her chest, described it as a "hollow type feeling." Patient denies having a sharp, dull, aching or heaviness sensation in her chest. Patient denies chest pain. Patient said that she didn't take her BP med this morning. Patient drove herself to the office today. No c/o dizziness, sob, fever, n/v, chills, swelling, numbness, tingling. Speech is clear, oriented x's 4, gait steady and answered questions appropriately. Patient said that she wanted to have her BP checked today. Medications reconciled. Appointment scheduled with Dr. Purvis SheffieldKoneswaran on 05/18/17. Patient advised to take her BP medication when she gets home and advised that if her symptoms get worse that she needed to go to the ED for an evaluation. Patient verbalized understanding of plan.

## 2017-05-04 ENCOUNTER — Telehealth: Payer: Self-pay

## 2017-05-04 NOTE — Telephone Encounter (Signed)
Patient contacted office stating that she was washing dishes and started having pressure in her chest. Patient states she has been having this pressure going on for around 2 hours. Advised patient she needed to go to the emergency room if she was having pressure in her chest that has been going on for 2 hours. Patient wanted to come into office for an EKG. Advised patient since she was just seen in office on Friday and told if symptoms worsen to go to the ED, then that is what she needed to do.

## 2017-05-11 ENCOUNTER — Encounter: Payer: Self-pay | Admitting: Cardiovascular Disease

## 2017-05-11 ENCOUNTER — Ambulatory Visit (INDEPENDENT_AMBULATORY_CARE_PROVIDER_SITE_OTHER): Payer: Medicaid Other | Admitting: Cardiovascular Disease

## 2017-05-11 VITALS — BP 136/88 | HR 72 | Ht 63.0 in | Wt 242.0 lb

## 2017-05-11 DIAGNOSIS — I10 Essential (primary) hypertension: Secondary | ICD-10-CM | POA: Diagnosis not present

## 2017-05-11 DIAGNOSIS — K219 Gastro-esophageal reflux disease without esophagitis: Secondary | ICD-10-CM | POA: Diagnosis not present

## 2017-05-11 DIAGNOSIS — R0789 Other chest pain: Secondary | ICD-10-CM

## 2017-05-11 MED ORDER — LOSARTAN POTASSIUM 50 MG PO TABS: 75.0000 mg | ORAL_TABLET | Freq: Every day | ORAL | 6 refills | Status: AC

## 2017-05-11 NOTE — Progress Notes (Signed)
SUBJECTIVE: The patient presents for past due follow-up.  She was last evaluated by me in May 2017.  She contacted our office on 05/04/17 complaining of chest pressure while washing the dishes.  She was advised to go to the ED.  She called on 04/30/17 complaining of feeling flushed and hot while working.  She described a "hollow type feeling "in her chest.  Blood pressure in our office was 146/94, heart rate 67 bpm.  ECG performed in the office today which I ordered and personally interpreted demonstrates normal sinus rhythm with no ischemic ST segment or T-wave abnormalities, nor any arrhythmias.  She told me that she went to the ED and I have requested these records.  She has been going through a difficult financial period.  She is only able to work 15 hours/week at [employer] in order to still maintain health insurance.  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  She has been under a lot of stress.  She said her blood pressure in the ED was 152/92.  It is 136/88 in our office today.  She has been experiencing intermittent retrosternal chest pressure radiating to the left scapular region.  There is also pressure in the left inframammary region.  She denies headaches.  She said her initial episode occurred while she was working at the [employer], clinical at [employer].  Her face became red and hot.  She denies fevers and diaphoresis.  She recently went to [place] and was dehydrated and broke out in fever blisters.  She took a medication to help prevent this and wondering if this may have worsened her symptoms.     Review of Systems: As per "subjective", otherwise negative.  No Known Allergies  Current Outpatient Prescriptions  Medication Sig Dispense Refill  . acyclovir (ZOVIRAX) 800 MG tablet Take 800 mg by mouth as needed.    Marland Kitchen amphetamine-dextroamphetamine (ADDERALL) 15 MG tablet Take 1 tablet by mouth 2 (two) times daily as needed.  0  .  aspirin-acetaminophen-caffeine (EXCEDRIN MIGRAINE) 250-250-65 MG per tablet Take 1 tablet by mouth every 6 (six) hours as needed for headache.    . furosemide (LASIX) 20 MG tablet TAKE 1 TABLET BY MOUTH EVERY DAY AS NEEDED 7 tablet 0  . losartan (COZAAR) 50 MG tablet TAKE 1 TABLET BY MOUTH DAILY 30 tablet 6  . OVER THE COUNTER MEDICATION Take 1 tablet by mouth daily. HBP solutions    . pantoprazole (PROTONIX) 40 MG tablet Take 1 tablet by mouth daily.    . potassium chloride SA (K-DUR,KLOR-CON) 20 MEQ tablet Take 1 tablet by mouth daily as needed. With furosemide     No current facility-administered medications for this visit.     Past Medical History:  Diagnosis Date  . ADD (attention deficit disorder)   . Allergic rhinitis   . Herpes labialis   . Hypertension   . IBS (irritable bowel syndrome)     Past Surgical History:  Procedure Laterality Date  . CHOLECYSTECTOMY      Social History   Social History  . Marital status: Single    Spouse name: N/A  . Number of children: N/A  . Years of education: N/A   Occupational History  . STUDENT     [employer]   Social History Main Topics  . Smoking status: Never Smoker  . Smokeless tobacco: Never Used  . Alcohol use 0.0 oz/week     Comment: 1-2 drinks, special  events 3-4 x's a year  . Drug use: Unknown  . Sexual activity: Not on file   Other Topics Concern  . Not on file   Social History Narrative   2 daughters     Vitals:   05/11/17 1621  BP: 136/88  Pulse: 72  SpO2: 96%  Weight: 242 lb (109.8 kg)  Height: 5\' 3"  (1.6 m)    Wt Readings from Last 3 Encounters:  05/11/17 242 lb (109.8 kg)  04/30/17 248 lb 3.2 oz (112.6 kg)  11/18/15 269 lb (122 kg)     PHYSICAL EXAM General: NAD HEENT: Normal. Neck: No JVD, no thyromegaly. Lungs: Clear to auscultation bilaterally with normal respiratory effort. CV: Regular rate and rhythm, normal S1/S2, no S3/S4, no murmur. No pretibial or periankle edema.  No carotid bruit.     Abdomen: Soft, nontender, no distention.  Neurologic: Alert and oriented.  Psych: Normal affect. Skin: Normal. Musculoskeletal: No gross deformities.    ECG: Most recent ECG reviewed.   Labs: Lab Results  Component Value Date/Time   K 3.5 12/28/2014 05:05 PM   BUN 13 12/28/2014 05:05 PM   CREATININE 0.88 12/28/2014 05:05 PM     Lipids: No results found for: LDLCALC, LDLDIRECT, CHOL, TRIG, HDL     ASSESSMENT AND PLAN: 1.  Chest pressure: This may have been due to accelerated hypertension and worsening GERD all being driven by social stressors.  I encouraged her to take Protonix 30-60 minutes before a meal.  I will also increase losartan to 75 mg daily to more optimally control blood pressure to see if this improves her symptoms.  I do not feel noninvasive cardiac testing is warranted at this time.  2.  Hypertension: This has been elevated recently. I will increase losartan to 75 mg daily to more optimally control blood pressure to see if this improves her symptoms.   3.  GERD:  I encouraged her to take Protonix 30-60 minutes before a meal.     Disposition: Follow up 3 months  Time spent: 40 minutes, of which greater than 50% was spent reviewing symptoms, relevant blood tests and studies, and discussing management plan with the patient.    Prentice DockerSuresh Carolena Fairbank, M.D., F.A.C.C.

## 2017-05-11 NOTE — Patient Instructions (Addendum)
Medication Instructions:   Increase Losartan to 75mg  daily.  Reminder to take Protonix 30-60 minutes prior to a meal.    Continue all other medications.    Labwork: none  Testing/Procedures: none  Follow-Up: 3 months   Any Other Special Instructions Will Be Listed Below (If Applicable).  If you need a refill on your cardiac medications before your next appointment, please call your pharmacy.

## 2017-05-12 ENCOUNTER — Encounter: Payer: Self-pay | Admitting: *Deleted

## 2017-05-18 ENCOUNTER — Ambulatory Visit: Payer: Self-pay | Admitting: Cardiovascular Disease

## 2017-06-09 ENCOUNTER — Other Ambulatory Visit (HOSPITAL_COMMUNITY): Payer: Self-pay | Admitting: Internal Medicine

## 2017-06-09 DIAGNOSIS — Z1231 Encounter for screening mammogram for malignant neoplasm of breast: Secondary | ICD-10-CM

## 2017-06-11 ENCOUNTER — Ambulatory Visit (HOSPITAL_COMMUNITY): Payer: Medicaid Other

## 2017-07-20 ENCOUNTER — Telehealth: Payer: Self-pay

## 2017-07-20 NOTE — Telephone Encounter (Signed)
Patient contacted office stating she was seen [facility] for chest [facility]. Patient states she was told it was GI related but needed a stress test and echo. [facility]

## 2017-07-20 NOTE — Telephone Encounter (Signed)
I will review records. Please schedule her for an appt with me within the next 3-4 weeks.

## 2017-07-21 NOTE — Telephone Encounter (Signed)
Patient notified of upcoming appointment

## 2017-07-21 NOTE — Telephone Encounter (Signed)
Appointment scheduled for 1/31. Attempted to contact patient. Unable to LM

## 2017-08-12 ENCOUNTER — Ambulatory Visit: Payer: Medicaid Other | Admitting: Cardiovascular Disease

## 2017-08-12 ENCOUNTER — Telehealth: Payer: Self-pay | Admitting: Cardiovascular Disease

## 2017-08-12 ENCOUNTER — Encounter: Payer: Self-pay | Admitting: *Deleted

## 2017-08-12 ENCOUNTER — Encounter: Payer: Self-pay | Admitting: Cardiovascular Disease

## 2017-08-12 VITALS — BP 120/98 | HR 72 | Ht 63.0 in | Wt 249.0 lb

## 2017-08-12 DIAGNOSIS — I10 Essential (primary) hypertension: Secondary | ICD-10-CM | POA: Diagnosis not present

## 2017-08-12 DIAGNOSIS — K219 Gastro-esophageal reflux disease without esophagitis: Secondary | ICD-10-CM

## 2017-08-12 DIAGNOSIS — R079 Chest pain, unspecified: Secondary | ICD-10-CM

## 2017-08-12 MED ORDER — AMLODIPINE BESYLATE 5 MG PO TABS: 5.0000 mg | ORAL_TABLET | Freq: Every day | ORAL | 6 refills | Status: DC

## 2017-08-12 NOTE — Patient Instructions (Signed)
Medication Instructions:   Begin Amlodipine 5mg  daily.  Continue all other medications.    Labwork: none  Testing/Procedures:  Your physician has requested that you have an echocardiogram. Echocardiography is a painless test that uses sound waves to create images of your heart. It provides your doctor with information about the size and shape of your heart and how well your heart's chambers and valves are working. This procedure takes approximately one hour. There are no restrictions for this procedure.  Your physician has requested that you have en exercise stress myoview. For further information please visit [email or web address]. Please follow instruction sheet, as given.  Office will contact with results via phone or letter.    Follow-Up: 6 weeks   Any Other Special Instructions Will Be Listed Below (If Applicable).  If you need a refill on your cardiac medications before your next appointment, please call your pharmacy.

## 2017-08-12 NOTE — Telephone Encounter (Signed)
Echo & Exercise Myoview /chest pain/koneswaran/srs Echo scheduled [facility] 08/19/17 Exercise [facility]iew [facility] Feb 11[facility]2019 arrive at 10am

## 2017-08-12 NOTE — Progress Notes (Signed)
SUBJECTIVE: The patient presents for follow-up of chest pressure and hypertension.  She was apparently evaluated in the ED [facility] for chest pain and was told it was GI related.  I increased losartan to 75 mg at her last visit with me in October 2018.  Diastolic blood pressure is elevated today, 98 mmHg.  I reviewed all records [facility] including documentation, labs, and studies.  Blood pressure was 165/101 with normal oxygen saturations.  Heart rate was 85 bpm.  Chest x-ray showed no active disease.  Labs: Sodium 142, potassium 3.6, BUN 11, creatinine 0.74, normal troponins x2, white blood cell 9.5, hemoglobin 12.7, platelets 263.  I reviewed the ECG which demonstrated normal sinus rhythm with mild nonspecific T wave abnormalities.  She has been having episodic chest pressure with some radiation to the right side of her neck.  She is also had pressure between her shoulder blades as well as left arm pain.  She walks with her daughter for 30 minutes twice daily without exertional chest pain and exertional dyspnea.  Her father underwent coronary artery bypass graft surgery at 46 years of age.  She would like to educate others and travel the world with her daughter.  Review of Systems: As per "subjective", otherwise negative.  No Known Allergies  Current Outpatient Medications  Medication Sig Dispense Refill  . acyclovir (ZOVIRAX) 800 MG tablet Take 800 mg by mouth as needed.    Marland Kitchen. amphetamine-dextroamphetamine (ADDERALL) 15 MG tablet Take 1 tablet by mouth 2 (two) times daily as needed.  0  . aspirin-acetaminophen-caffeine (EXCEDRIN MIGRAINE) 250-250-65 MG per tablet Take 1 tablet by mouth every 6 (six) hours as needed for headache.    . famotidine (PEPCID) 10 MG tablet Take 10 mg by mouth 2 (two) times daily.    . furosemide (LASIX) 20 MG tablet TAKE 1 TABLET BY MOUTH EVERY DAY AS NEEDED 7 tablet 0  . losartan (COZAAR) 50 MG tablet Take 1.5 tablets (75 mg  total) by mouth daily. 45 tablet 6  . OVER THE COUNTER MEDICATION Take 1 tablet by mouth daily. HBP solutions    . pantoprazole (PROTONIX) 40 MG tablet Take 1 tablet by mouth daily.    . potassium chloride SA (K-DUR,KLOR-CON) 20 MEQ tablet Take 1 tablet by mouth daily as needed. With furosemide     No current facility-administered medications for this visit.     Past Medical History:  Diagnosis Date  . ADD (attention deficit disorder)   . Allergic rhinitis   . Herpes labialis   . Hypertension   . IBS (irritable bowel syndrome)     Past Surgical History:  Procedure Laterality Date  . CHOLECYSTECTOMY      Social History   Socioeconomic History  . Marital status: Single    Spouse name: Not on file  . Number of children: Not on file  . Years of education: Not on file  . Highest education level: Not on file  Social Needs  . Financial resource strain: Not on file  . Food insecurity - worry: Not on file  . Food insecurity - inability: Not on file  . Transportation needs - medical: Not on file  . Transportation needs - non-medical: Not on file  Occupational History  . Occupation: STUDENT    Comment: [employer]  Tobacco Use  . Smoking status: Never Smoker  . Smokeless tobacco: Never Used  Substance and Sexual Activity  . Alcohol use: Yes    Alcohol/week:  0.0 oz    Comment: 1-2 drinks, special events 3-4 x's a year  . Drug use: Not on file  . Sexual activity: Not on file  Other Topics Concern  . Not on file  Social History Narrative   2 daughters     Vitals:   08/12/17 1418  BP: (!) 120/98  Pulse: 72  SpO2: 97%  Weight: 249 lb (112.9 kg)  Height: 5\' 3"  (1.6 m)    Wt Readings from Last 3 Encounters:  08/12/17 249 lb (112.9 kg)  05/11/17 242 lb (109.8 kg)  04/30/17 248 lb 3.2 oz (112.6 kg)     PHYSICAL EXAM General: NAD HEENT: Normal. Neck: No JVD, no thyromegaly. Lungs: Clear to auscultation bilaterally with normal respiratory effort. CV: Regular rate and  rhythm, normal S1/S2, no S3/S4, no murmur. No pretibial or periankle edema.  No carotid bruit.   Abdomen: Soft, nontender, no distention.  Neurologic: Alert and oriented.  Psych: Normal affect. Skin: Normal. Musculoskeletal: No gross deformities.    ECG: Most recent ECG reviewed.   Labs: Lab Results  Component Value Date/Time   K 3.5 12/28/2014 05:05 PM   BUN 13 12/28/2014 05:05 PM   CREATININE 0.88 12/28/2014 05:05 PM     Lipids: No results found for: LDLCALC, LDLDIRECT, CHOL, TRIG, HDL     ASSESSMENT AND PLAN: 1.  Chest pressure:  She continues to have episodic chest pressure which is not related to exertion.  I still feel this may be related to accelerated hypertension.  She does have a family history of premature coronary artery disease in her father.  I will add amlodipine 5 mg daily given her elevated diastolic blood pressure today. I will order a 2-D echocardiogram with Doppler to evaluate cardiac structure, function, and regional wall motion. I will obtain an exercise Myoview stress test to evaluate for ischemic heart disease.  2.  Hypertension: Diastolic blood pressure remains elevated.  I will add amlodipine 5 mg daily.  She is already taking losartan 75 mill grams daily.  3.  GERD:  I previously encouraged her to take Protonix 30-60 minutes before a meal.    Disposition: Follow up 6 weeks.  Time spent: 40 minutes, of which greater than 50% was spent reviewing symptoms, relevant blood tests and studies, and discussing management plan with the patient.    Prentice Docker, M.D., F.A.C.C.

## 2017-08-19 ENCOUNTER — Ambulatory Visit (INDEPENDENT_AMBULATORY_CARE_PROVIDER_SITE_OTHER): Payer: Medicaid Other

## 2017-08-19 ENCOUNTER — Other Ambulatory Visit: Payer: Self-pay

## 2017-08-19 DIAGNOSIS — R079 Chest pain, unspecified: Secondary | ICD-10-CM

## 2017-08-23 ENCOUNTER — Encounter (HOSPITAL_COMMUNITY)
Admission: RE | Admit: 2017-08-23 | Discharge: 2017-08-23 | Disposition: A | Discharge status: Home / Self Care | Payer: Medicaid Other | Source: Ambulatory Visit | Attending: Cardiovascular Disease | Admitting: Cardiovascular Disease

## 2017-08-23 ENCOUNTER — Encounter (HOSPITAL_BASED_OUTPATIENT_CLINIC_OR_DEPARTMENT_OTHER)
Admission: RE | Admit: 2017-08-23 | Discharge: 2017-08-23 | Disposition: A | Discharge status: Home / Self Care | Payer: Medicaid Other | Source: Ambulatory Visit | Attending: Cardiovascular Disease | Admitting: Cardiovascular Disease

## 2017-08-23 ENCOUNTER — Encounter (HOSPITAL_COMMUNITY): Payer: Self-pay

## 2017-08-23 DIAGNOSIS — R079 Chest pain, unspecified: Secondary | ICD-10-CM | POA: Insufficient documentation

## 2017-08-23 LAB — NM MYOCAR MULTI W/SPECT W/WALL MOTION / EF
CHL CUP NUCLEAR SSS: 2
CSEPED: 5 min
Estimated workload: 7 METS
Exercise duration (sec): 33 s
LHR: 0.43
LV dias vol: 92 mL (ref 46–106)
LVSYSVOL: 33 mL
MPHR: 175 {beats}/min
Peak HR: 164 {beats}/min
Percent HR: 93 %
RPE: 13
Rest HR: 78 {beats}/min
SDS: 0
SRS: 2
TID: 1.1

## 2017-08-23 MED ORDER — TECHNETIUM TC 99M TETROFOSMIN IV KIT
10.0000 | PACK | Freq: Once | INTRAVENOUS | Status: AC | PRN
  Administered 2017-08-23: 10.5 via INTRAVENOUS

## 2017-08-23 MED ORDER — TECHNETIUM TC 99M TETROFOSMIN IV KIT
30.0000 | PACK | Freq: Once | INTRAVENOUS | Status: AC | PRN
  Administered 2017-08-23: 31 via INTRAVENOUS

## 2017-08-23 MED ORDER — SODIUM CHLORIDE 0.9% FLUSH
INTRAVENOUS | Status: AC
  Administered 2017-08-23: 10 mL via INTRAVENOUS
  Filled 2017-08-23: qty 10

## 2017-08-23 MED ORDER — REGADENOSON 0.4 MG/5ML IV SOLN
INTRAVENOUS | Status: AC
  Filled 2017-08-23: qty 5

## 2017-08-24 ENCOUNTER — Telehealth: Payer: Self-pay | Admitting: *Deleted

## 2017-08-24 ENCOUNTER — Encounter: Payer: Self-pay | Admitting: *Deleted

## 2017-08-24 NOTE — Telephone Encounter (Signed)
Notes recorded by Lesle ChrisHill, Angela G, LPN on 1/61/09602/06/2018 at 9:25 AM EST Patient notified of results by letter. No pmd listed.  Follow up scheduled for March with Dr. Purvis SheffieldKoneswaran. ------  Notes recorded by Lesle ChrisHill, Angela G, LPN on 4/5/40982/02/2018 at 6:16 PM EST Mailbox full. ------  Notes recorded by Antoine PocheBranch, Jonathan F, MD on 08/20/2017 at 12:48 PM EST Normal echo, heart function is normal. Dr Kirtland BouchardK to discuss further at f/u   Dominga FerryJ Branch MD

## 2017-08-25 ENCOUNTER — Telehealth: Payer: Self-pay

## 2017-08-25 NOTE — Telephone Encounter (Signed)
-----   Message from Fonnie BirkenheadLisa R McGhee, [place] sent at 08/25/2017  9:05 AM EST -----  [facility] [facility].  ----- Message ----- From: Laqueta LindenKoneswaran, Suresh A, MD Sent: 08/23/2017   2:40 PM To: [name], RN  Normal.

## 2017-08-25 NOTE — Telephone Encounter (Signed)
Patient notified

## 2017-09-23 ENCOUNTER — Encounter: Payer: Self-pay | Admitting: Cardiovascular Disease

## 2017-09-23 ENCOUNTER — Ambulatory Visit (INDEPENDENT_AMBULATORY_CARE_PROVIDER_SITE_OTHER): Payer: Medicaid Other | Admitting: Cardiovascular Disease

## 2017-09-23 VITALS — BP 138/90 | HR 76 | Ht 63.0 in | Wt 241.0 lb

## 2017-09-23 DIAGNOSIS — R079 Chest pain, unspecified: Secondary | ICD-10-CM

## 2017-09-23 DIAGNOSIS — I10 Essential (primary) hypertension: Secondary | ICD-10-CM | POA: Diagnosis not present

## 2017-09-23 NOTE — Progress Notes (Signed)
SUBJECTIVE: The patient returns for follow-up after undergoing cardiovascular testing performed for the evaluation of chest pain.  She underwent a normal nuclear stress test on 08/23/17.  Echocardiogram was also normal, LVEF 60-65%.  She had been taking chewable baby aspirin but after switching to enteric-coated aspirin symptoms of chest pain entirely resolved.  She has not been taking amlodipine.   Review of Systems: As per "subjective", otherwise negative.  No Known Allergies  Current Outpatient Medications  Medication Sig Dispense Refill  . acyclovir (ZOVIRAX) 800 MG tablet Take 800 mg by mouth as needed.    Marland Kitchen amphetamine-dextroamphetamine (ADDERALL) 15 MG tablet Take 1 tablet by mouth 2 (two) times daily as needed.  0  . aspirin-acetaminophen-caffeine (EXCEDRIN MIGRAINE) 250-250-65 MG per tablet Take 1 tablet by mouth every 6 (six) hours as needed for headache.    . famotidine (PEPCID) 10 MG tablet Take 10 mg by mouth 2 (two) times daily.    . furosemide (LASIX) 20 MG tablet TAKE 1 TABLET BY MOUTH EVERY DAY AS NEEDED 7 tablet 0  . losartan (COZAAR) 50 MG tablet Take 1.5 tablets (75 mg total) by mouth daily. 45 tablet 6  . OVER THE COUNTER MEDICATION Take 1 tablet by mouth daily. HBP solutions    . pantoprazole (PROTONIX) 40 MG tablet Take 1 tablet by mouth daily.    . potassium chloride SA (K-DUR,KLOR-CON) 20 MEQ tablet Take 1 tablet by mouth daily as needed. With furosemide     No current facility-administered medications for this visit.     Past Medical History:  Diagnosis Date  . ADD (attention deficit disorder)   . Allergic rhinitis   . Herpes labialis   . Hypertension   . IBS (irritable bowel syndrome)     Past Surgical History:  Procedure Laterality Date  . CHOLECYSTECTOMY      Social History   Socioeconomic History  . Marital status: Single    Spouse name: Not on file  . Number of children: Not on file  . Years of education: Not on file  .  Highest education level: Not on file  Social Needs  . Financial resource strain: Not on file  . Food insecurity - worry: Not on file  . Food insecurity - inability: Not on file  . Transportation needs - medical: Not on file  . Transportation needs - non-medical: Not on file  Occupational History  . Occupation: STUDENT    Comment: [employer]  Tobacco Use  . Smoking status: Never Smoker  . Smokeless tobacco: Never Used  Substance and Sexual Activity  . Alcohol use: Yes    Alcohol/week: 0.0 oz    Comment: 1-2 drinks, special events 3-4 x's a year  . Drug use: Not on file  . Sexual activity: Not on file  Other Topics Concern  . Not on file  Social History Narrative   2 daughters     Vitals:   09/23/17 1419  BP: 138/90  Pulse: 76  SpO2: 98%  Weight: 241 lb (109.3 kg)  Height: 5\' 3"  (1.6 m)    Wt Readings from Last 3 Encounters:  09/23/17 241 lb (109.3 kg)  08/12/17 249 lb (112.9 kg)  05/11/17 242 lb (109.8 kg)     PHYSICAL EXAM General: NAD HEENT: Normal. Neck: No JVD, no thyromegaly. Lungs: Clear to auscultation bilaterally with normal respiratory effort. CV: Regular rate and rhythm, normal S1/S2, no S3/S4, no murmur. No pretibial or periankle edema.  Abdomen: Soft, nontender, no  distention.  Neurologic: Alert and oriented.  Psych: Normal affect. Skin: Normal. Musculoskeletal: No gross deformities.    ECG: Most recent ECG reviewed.   Labs: Lab Results  Component Value Date/Time   K 3.5 12/28/2014 05:05 PM   BUN 13 12/28/2014 05:05 PM   CREATININE 0.88 12/28/2014 05:05 PM     Lipids: No results found for: LDLCALC, LDLDIRECT, CHOL, TRIG, HDL     ASSESSMENT AND PLAN: 1.  Chest pressure: Symptoms resolved with switching from chewable baby aspirin to enteric-coated aspirin.  Both nuclear stress testing and echocardiography were normal as detailed above.  Given that she has no cardiovascular disease, aspirin is not indicated.  I instructed her to stop taking  this.  2.  Accelerated hypertension: Blood pressure is mildly elevated.  She did not start amlodipine.  I encouraged her to do so as goal blood pressure should consistently be less than 130/80.  I also encouraged her to continue to pursue lifestyle modification with exercise and weight loss.  She has agreed to do so.  3.  GERD: I previously encouraged her to take Protonix 30-60 minutes before a meal.      Disposition: Follow up as needed   Prentice DockerSuresh Paxtyn Boyar, M.D., F.A.C.C.

## 2017-09-23 NOTE — Patient Instructions (Addendum)
Medication Instructions:   Stop Aspirin.   Continue all other current medications.  Labwork: none  Testing/Procedures: none  Follow-Up: As needed   Any Other Special Instructions Will Be Listed Below (If Applicable).  If you need a refill on your cardiac medications before your next appointment, please call your pharmacy.

## 2018-03-12 ENCOUNTER — Other Ambulatory Visit: Payer: Self-pay | Admitting: Cardiovascular Disease

## 2018-03-31 IMAGING — NM NM MYOCAR MULTI W/SPECT W/WALL MOTION & EF
2 series · 12 of 12 positions shown · non-contrast
Comparison: none

[Series 1: rest · 6.51mm/px · 6 of 64 frames shown]
[frame 6/64]
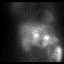
[frame 16/64]
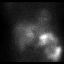
[frame 27/64]
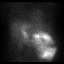
[frame 38/64]
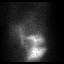
[frame 48/64]
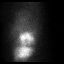
[frame 59/64]
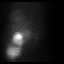

[Series 2: stress gated - perfusion · 6.51mm/px · 6 of 64 frames shown]
[frame 6/64]
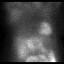
[frame 16/64]
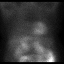
[frame 27/64]
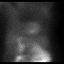
[frame 38/64]
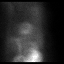
[frame 48/64]
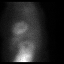
[frame 59/64]
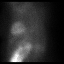

[12 of 12 positions shown; findings below may reference images not displayed]

Canned report from images found in remote index.

Refer to host system for actual result text.

## 2018-06-16 ENCOUNTER — Other Ambulatory Visit: Payer: Self-pay | Admitting: Cardiovascular Disease
